# Patient Record
Sex: Male | Born: 1980 | Race: White | Hispanic: No | Marital: Married | Smoking: Never smoker
Health system: Southern US, Community
[De-identification: ages and names within clinical notes are randomized; demographics above are authoritative.]

---

## 2018-02-16 ENCOUNTER — Encounter (HOSPITAL_COMMUNITY): Payer: Self-pay | Admitting: *Deleted

## 2018-02-16 ENCOUNTER — Emergency Department (HOSPITAL_COMMUNITY): Payer: 59

## 2018-02-16 ENCOUNTER — Emergency Department (HOSPITAL_COMMUNITY)
Admission: EM | Admit: 2018-02-16 | Discharge: 2018-02-16 | Disposition: A | Discharge status: Home / Self Care | Payer: 59 | Attending: Emergency Medicine | Admitting: Emergency Medicine

## 2018-02-16 DIAGNOSIS — M7918 Myalgia, other site: Secondary | ICD-10-CM | POA: Insufficient documentation

## 2018-02-16 DIAGNOSIS — R509 Fever, unspecified: Secondary | ICD-10-CM | POA: Diagnosis present

## 2018-02-16 DIAGNOSIS — J22 Unspecified acute lower respiratory infection: Secondary | ICD-10-CM | POA: Insufficient documentation

## 2018-02-16 DIAGNOSIS — J988 Other specified respiratory disorders: Secondary | ICD-10-CM

## 2018-02-16 DIAGNOSIS — R51 Headache: Secondary | ICD-10-CM | POA: Insufficient documentation

## 2018-02-16 DIAGNOSIS — R05 Cough: Secondary | ICD-10-CM | POA: Insufficient documentation

## 2018-02-16 DIAGNOSIS — R059 Cough, unspecified: Secondary | ICD-10-CM

## 2018-02-16 LAB — BASIC METABOLIC PANEL
Anion gap: 13 (ref 5–15)
BUN: 11 mg/dL (ref 6–20)
CALCIUM: 8.9 mg/dL (ref 8.9–10.3)
CHLORIDE: 96 mmol/L — AB (ref 101–111)
CO2: 26 mmol/L (ref 22–32)
CREATININE: 1.05 mg/dL (ref 0.61–1.24)
Glucose, Bld: 97 mg/dL (ref 65–99)
Potassium: 3.6 mmol/L (ref 3.5–5.1)
SODIUM: 135 mmol/L (ref 135–145)

## 2018-02-16 LAB — CBC
HCT: 43.3 % (ref 39.0–52.0)
HEMOGLOBIN: 14.6 g/dL (ref 13.0–17.0)
MCH: 29.1 pg (ref 26.0–34.0)
MCHC: 33.7 g/dL (ref 30.0–36.0)
MCV: 86.4 fL (ref 78.0–100.0)
PLATELETS: 285 10*3/uL (ref 150–400)
RBC: 5.01 MIL/uL (ref 4.22–5.81)
RDW: 13 % (ref 11.5–15.5)
WBC: 11.9 10*3/uL — ABNORMAL HIGH (ref 4.0–10.5)

## 2018-02-16 LAB — MONONUCLEOSIS SCREEN: Mono Screen: NEGATIVE

## 2018-02-16 LAB — INFLUENZA PANEL BY PCR (TYPE A & B)
INFLAPCR: NEGATIVE
INFLBPCR: NEGATIVE

## 2018-02-16 LAB — GROUP A STREP BY PCR: Group A Strep by PCR: NOT DETECTED

## 2018-02-16 MED ORDER — METHYLPREDNISOLONE 4 MG PO TBPK: ORAL_TABLET | ORAL | 0 refills | Status: AC

## 2018-02-16 MED ORDER — DEXAMETHASONE SODIUM PHOSPHATE 10 MG/ML IJ SOLN
10.0000 mg | Freq: Once | INTRAMUSCULAR | Status: AC
  Administered 2018-02-16: 10 mg via INTRAMUSCULAR
  Filled 2018-02-16: qty 1

## 2018-02-16 MED ORDER — SODIUM CHLORIDE 0.9 % IV SOLN
1.0000 g | Freq: Once | INTRAVENOUS | Status: AC
  Administered 2018-02-16: 1 g via INTRAVENOUS
  Filled 2018-02-16: qty 10

## 2018-02-16 MED ORDER — SODIUM CHLORIDE 0.9 % IV BOLUS
1000.0000 mL | Freq: Once | INTRAVENOUS | Status: AC
  Administered 2018-02-16: 1000 mL via INTRAVENOUS

## 2018-02-16 MED ORDER — IPRATROPIUM-ALBUTEROL 0.5-2.5 (3) MG/3ML IN SOLN
3.0000 mL | Freq: Once | RESPIRATORY_TRACT | Status: AC
  Administered 2018-02-16: 3 mL via RESPIRATORY_TRACT
  Filled 2018-02-16: qty 3

## 2018-02-16 NOTE — ED Provider Notes (Signed)
[facility] Provider Note   CSN: [id number] Arrival date & time: 02/16/18  0847     History   Chief Complaint Chief Complaint  Patient presents with  . Fever  . Chills  . Cough    HPI Ron AgeeBrian Sopko is a 37 y.o. male who presents the emergency department for URI symptoms.  Patient has had 11 days of persistent cough.  He also has associated loss of voice, headaches body aches, anorexia.  Patient states that at night when he is not taking alternating Motrin or Tylenol he wakes up nightly with a fever up to 102.  He has been using codeine cough syrup without relief of his symptoms.  He has had severe coughing with episodes of posttussive gagging.  He was started on azithromycin yesterday and took 2 pills.  He has no recent foreign travel.  He has not been tested for the flu but his wife is currently on Tamiflu prophylaxis.  She is worried because they have an 2544-month-old at home.  He denies urinary symptoms, abdominal pain, nausea, vomiting, ear pain.     History reviewed. No pertinent past medical history.  There are no active problems to display for this patient.   History reviewed. No pertinent surgical history.      Home Medications    Prior to Admission medications   Not on File    Family History No family history on file.  Social History Social History   Tobacco Use  . Smoking status: Never Smoker  . Smokeless tobacco: Never Used  Substance Use Topics  . Alcohol use: Yes  . Drug use: Not on file     Allergies   Patient has no allergy information on record.   Review of Systems Review of Systems Ten systems reviewed and are negative for acute change, except as noted in the HPI.    Physical Exam Updated Vital Signs BP (!) 146/89 (BP Location: Left Arm)   Pulse (!) 115   Temp 98.2 F (36.8 C) (Oral)   Resp 20   Ht 6' (1.829 m)   Wt 98.4 kg (216 lb 14.4 oz)   SpO2 95%   BMI 29.42 kg/m   Physical Exam    Constitutional: He appears well-developed and well-nourished. No distress.  Patient appears ill.  HENT:  Head: Normocephalic and atraumatic.  TMs normal bilaterally  Eyes: Pupils are equal, round, and reactive to light. Conjunctivae and EOM are normal. No scleral icterus.  Neck: Normal range of motion. Neck supple.  Cardiovascular: Normal rate, regular rhythm and normal heart sounds.  Pulmonary/Chest: Effort normal and breath sounds normal. No respiratory distress. He has no wheezes. He has no rales.  Abdominal: Soft. Bowel sounds are normal. He exhibits no distension. There is no tenderness.  Musculoskeletal: He exhibits no edema.  Neurological: He is alert.  Skin: Skin is warm and dry. He is not diaphoretic.  Psychiatric: His behavior is normal.  Nursing note and vitals reviewed.    ED Treatments / Results  Labs (all labs ordered are listed, but only abnormal results are displayed) Labs Reviewed  GROUP A STREP BY PCR  CBC  BASIC METABOLIC PANEL  INFLUENZA PANEL BY PCR (TYPE A & B)  MONONUCLEOSIS SCREEN    EKG None  Radiology Dg Chest 2 View  Result Date: 02/16/2018 CLINICAL DATA:  Upper chest pain, cough for the last 10 days, intermittent fever EXAM: CHEST - 2 VIEW COMPARISON:  None. FINDINGS: No active infiltrate or effusion  is seen. There is mild peribronchial thickening and bronchitis cannot be excluded. Mediastinal and hilar contours are unremarkable. The heart is within normal limits in size. No acute bony abnormality is seen. IMPRESSION: No definite pneumonia or effusion.  Cannot exclude bronchitis. Electronically Signed   By: Dwyane Dee M.D.   On: 02/16/2018 11:41    Procedures Procedures (including critical care time)  Medications Ordered in ED Medications  sodium chloride 0.9 % bolus 1,000 mL (has no administration in time range)  dexamethasone (DECADRON) injection 10 mg (has no administration in time range)  cefTRIAXone (ROCEPHIN) 1 g in sodium chloride 0.9  % 100 mL IVPB (has no administration in time range)  ipratropium-albuterol (DUONEB) 0.5-2.5 (3) MG/3ML nebulizer solution 3 mL (has no administration in time range)     Initial Impression / Assessment and Plan / ED Course  I have reviewed the triage vital signs and the nursing notes.  Pertinent labs & imaging results that were available during my care of the patient were reviewed by me and considered in my medical decision making (see chart for details).     Patient's workup negative here in the emergency department.  Given his persistent fevers will cover for potential underlying pneumonia.  Patient is currently taking azithromycin.  I have added Rocephin here in the emergency department for broader coverage.  Feel there is a component of reactive airway and patient has been given steroids for treatment of his cough.  Patient appears appropriate for discharge at this time.  Follow-up with PCP in the next 2 days.  Discussed return precautions the patient Final Clinical Impressions(s) / ED Diagnoses   Final diagnoses:  Cough    ED Discharge Orders    None       Arthor Captain, PA-C 02/16/18 2317    Samuel Jester, DO 02/20/18 [phone]

## 2018-02-16 NOTE — Discharge Instructions (Signed)
Begin taking the Medrol pack on 02/18/2017.  You may use Nyquil/ dayquil or Delsym for cough suppression. Please see your doctor in follow up if your fevers are not improving.  RETURN IMMEDIATELY IF you develop shortness of breath, confusion or altered mental status, a new rash, become dizzy, faint, or poorly responsive, or are unable to be cared for at home.

## 2018-02-16 NOTE — ED Triage Notes (Signed)
Pt c/o fever chills x 10 days. Pt seen at clinic, given medication for cough and nasal drainage, told to alternate motrin and tylenol. Pt was seen again, given codeine for cough, which helped him sleep but didn't help w/ cough. Pt has fever of 101.9, which he took tylenol for. Pt started z-pack yesterday. Pt states he now feels weak.

## 2018-10-19 IMAGING — CR DG CHEST 2V
2 series · 2 of 2 positions shown · non-contrast
Comparison: None.

CLINICAL DATA: Upper chest pain, cough for the last 10 days,
intermittent fever

EXAM:
CHEST - 2 VIEW

[w chest pa]
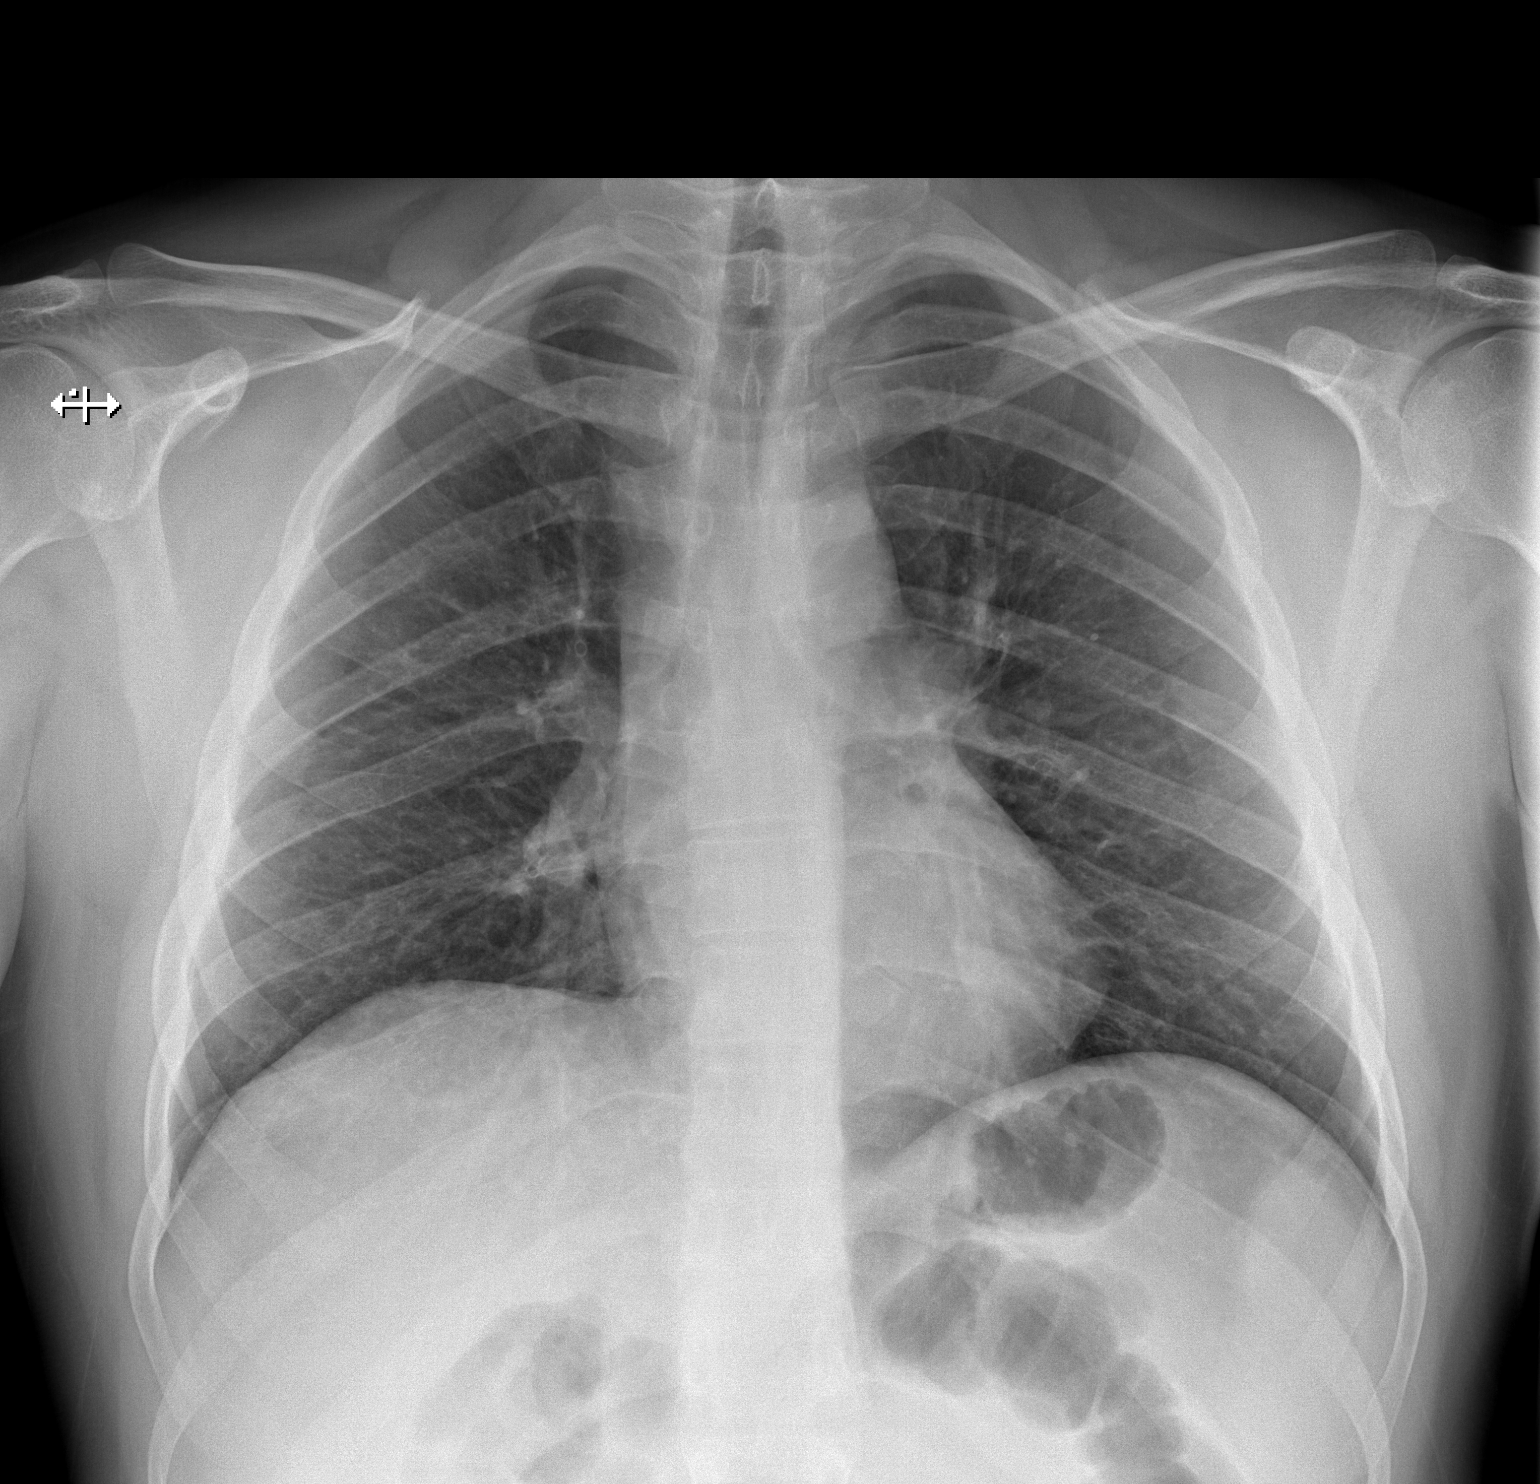

[w chest lat]
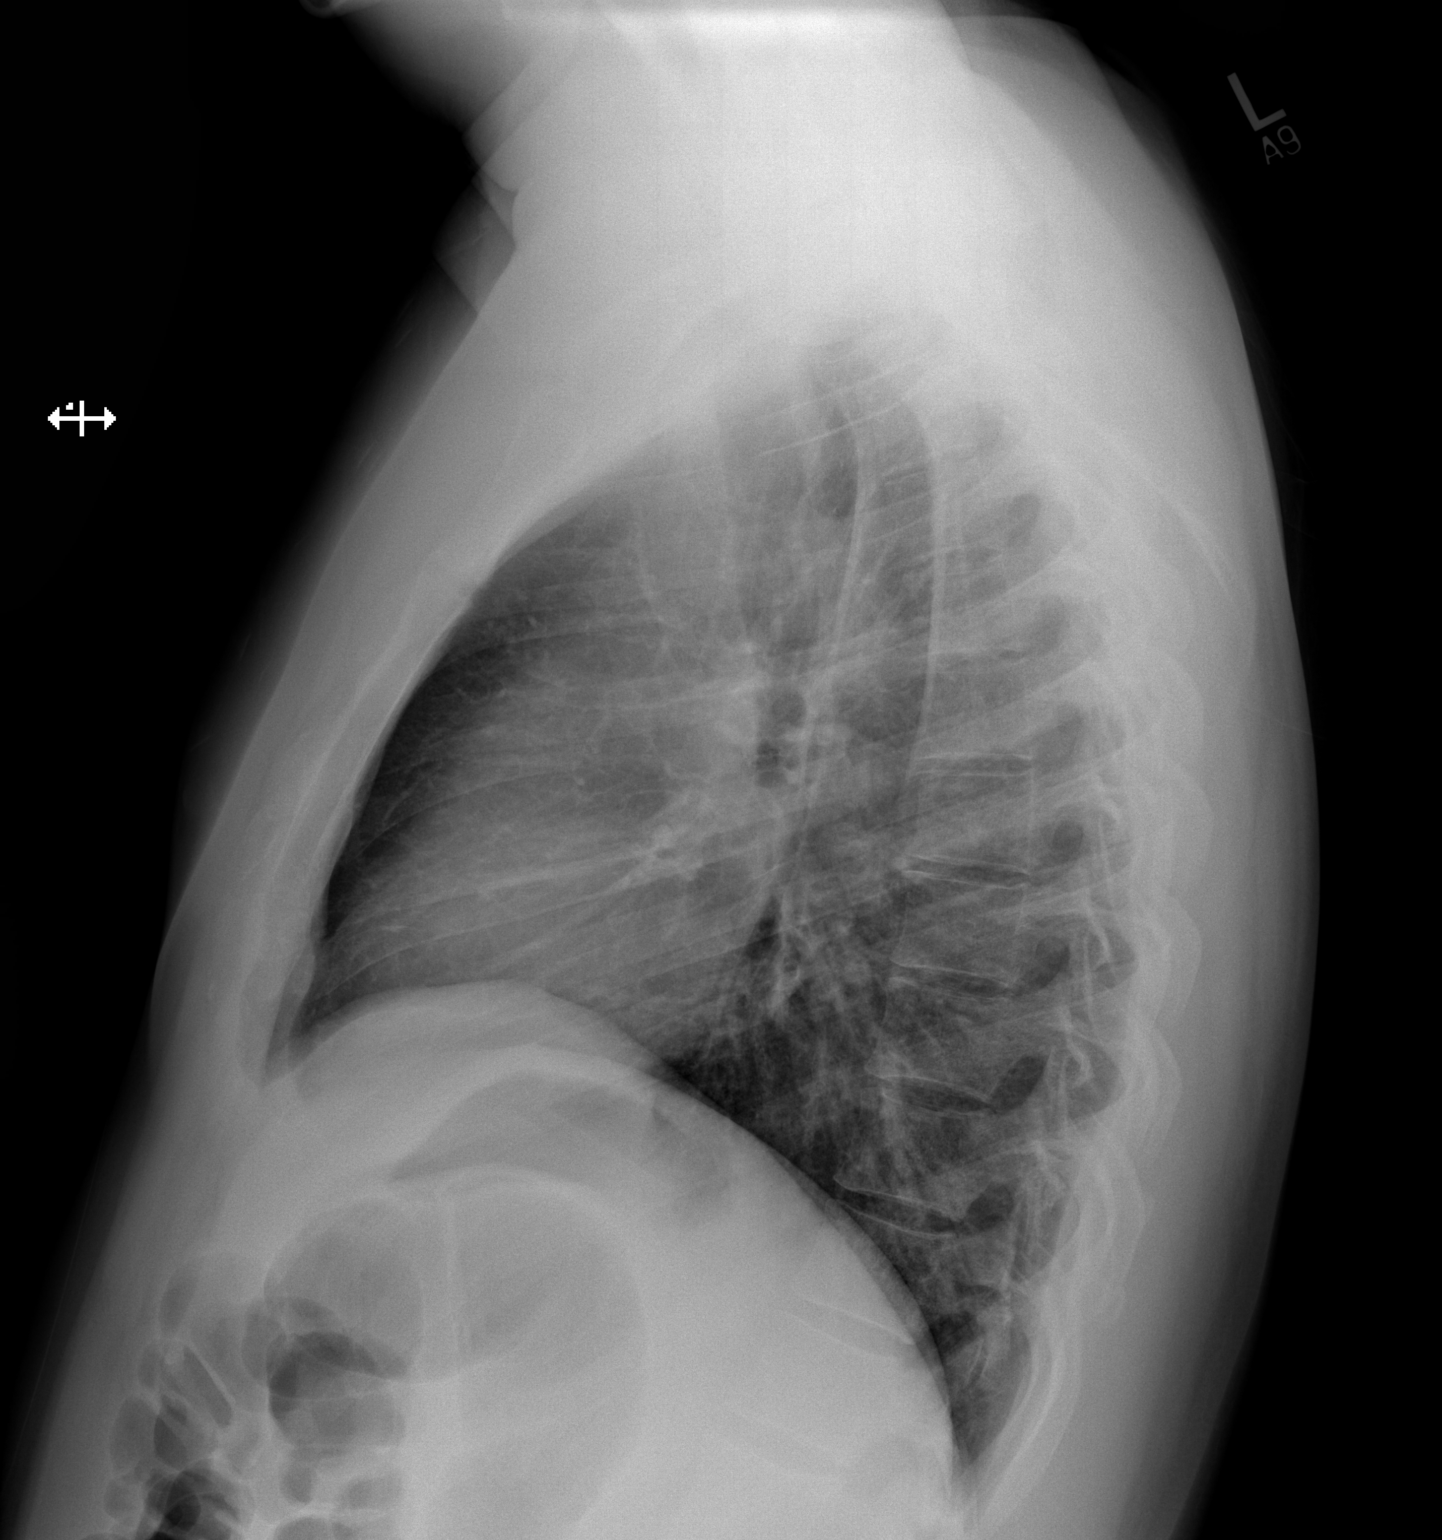

[2 of 2 positions shown; findings below may reference images not displayed]

FINDINGS: No active infiltrate or effusion is seen. There is mild
peribronchial thickening and bronchitis cannot be excluded.
Mediastinal and hilar contours are unremarkable. The heart is within
normal limits in size. No acute bony abnormality is seen.
IMPRESSION: No definite pneumonia or effusion.  Cannot exclude bronchitis.
